# Patient Record
Sex: Female | Born: 1970 | Race: Black or African American | Hispanic: No | Marital: Married | Smoking: Never smoker
Health system: Southern US, Community
[De-identification: ages and names within clinical notes are randomized; demographics above are authoritative.]

## PROBLEM LIST (undated history)

## (undated) DIAGNOSIS — N289 Disorder of kidney and ureter, unspecified: Secondary | ICD-10-CM

---

## 2018-04-05 ENCOUNTER — Other Ambulatory Visit: Payer: Self-pay

## 2018-04-05 ENCOUNTER — Emergency Department (HOSPITAL_COMMUNITY)
Admission: EM | Admit: 2018-04-05 | Discharge: 2018-04-05 | Disposition: A | Discharge status: Home / Self Care | Attending: Emergency Medicine | Admitting: Emergency Medicine

## 2018-04-05 ENCOUNTER — Encounter (HOSPITAL_COMMUNITY): Payer: Self-pay

## 2018-04-05 ENCOUNTER — Emergency Department (HOSPITAL_COMMUNITY)

## 2018-04-05 DIAGNOSIS — R1031 Right lower quadrant pain: Secondary | ICD-10-CM | POA: Insufficient documentation

## 2018-04-05 DIAGNOSIS — R109 Unspecified abdominal pain: Secondary | ICD-10-CM

## 2018-04-05 HISTORY — DX: Disorder of kidney and ureter, unspecified: N28.9

## 2018-04-05 LAB — URINALYSIS, ROUTINE W REFLEX MICROSCOPIC
Bilirubin Urine: NEGATIVE
Glucose, UA: NEGATIVE mg/dL
Hgb urine dipstick: NEGATIVE
Ketones, ur: NEGATIVE mg/dL
Leukocytes,Ua: NEGATIVE
Nitrite: NEGATIVE
Protein, ur: NEGATIVE mg/dL
SPECIFIC GRAVITY, URINE: 1.015 (ref 1.005–1.030)
pH: 5 (ref 5.0–8.0)

## 2018-04-05 LAB — HEPATIC FUNCTION PANEL
ALT: 15 U/L (ref 0–44)
AST: 19 U/L (ref 15–41)
Albumin: 3.7 g/dL (ref 3.5–5.0)
Alkaline Phosphatase: 88 U/L (ref 38–126)
Bilirubin, Direct: <0.1 mg/dL (ref 0.0–0.2)
Total Bilirubin: 0.8 mg/dL (ref 0.3–1.2)
Total Protein: 6.4 g/dL — ABNORMAL LOW (ref 6.5–8.1)

## 2018-04-05 LAB — I-STAT BETA HCG BLOOD, ED (MC, WL, AP ONLY): I-stat hCG, quantitative: <5 m[IU]/mL (ref <5)

## 2018-04-05 LAB — CBC
HCT: 38.4 % (ref 36.0–46.0)
Hemoglobin: 12.6 g/dL (ref 12.0–15.0)
MCH: 28.3 pg (ref 26.0–34.0)
MCHC: 32.8 g/dL (ref 30.0–36.0)
MCV: 86.1 fL (ref 80.0–100.0)
NRBC: 0 % (ref 0.0–0.2)
Platelets: 195 10*3/uL (ref 150–400)
RBC: 4.46 MIL/uL (ref 3.87–5.11)
RDW: 12 % (ref 11.5–15.5)
WBC: 7.8 10*3/uL (ref 4.0–10.5)

## 2018-04-05 LAB — LIPASE, BLOOD: Lipase: 34 U/L (ref 11–51)

## 2018-04-05 LAB — BASIC METABOLIC PANEL
Anion gap: 7 (ref 5–15)
BUN: 9 mg/dL (ref 6–20)
CO2: 24 mmol/L (ref 22–32)
Calcium: 9 mg/dL (ref 8.9–10.3)
Chloride: 106 mmol/L (ref 98–111)
Creatinine, Ser: 0.77 mg/dL (ref 0.44–1.00)
GFR calc non Af Amer: >60 mL/min (ref >60)
Glucose, Bld: 87 mg/dL (ref 70–99)
Potassium: 4.5 mmol/L (ref 3.5–5.1)
Sodium: 137 mmol/L (ref 135–145)

## 2018-04-05 MED ORDER — KETOROLAC TROMETHAMINE 30 MG/ML IJ SOLN
30.0000 mg | Freq: Once | INTRAMUSCULAR | Status: AC
  Administered 2018-04-05: 30 mg via INTRAMUSCULAR
  Filled 2018-04-05: qty 1

## 2018-04-05 MED ORDER — DICLOFENAC SODIUM 1 % TD GEL: 2.0000 g | Freq: Four times a day (QID) | TRANSDERMAL | 0 refills | Status: AC | PRN

## 2018-04-05 NOTE — ED Triage Notes (Signed)
Pt reports right flank pain. States hx of kidney infections. No hx of kidney stones. Denies n/v. Pt reports she has been taking OTC ibuprofen.

## 2018-04-05 NOTE — ED Provider Notes (Signed)
Emergency Department Provider Note   I have reviewed the triage vital signs and the nursing notes.   HISTORY  Chief Complaint Flank Pain   HPI Sherry Lee is a 48 y.o. female with prior history of kidney infection presents to the emergency department with right flank pain.  Pain began yesterday.  She describes it as her right flank without significant radiation to the anterior abdomen.  She denies any dysuria, hesitancy, urgency.  No vaginal bleeding or discharge.  No concern for sexually transmitted infections.  Patient denies any known injury.  No history of kidney stones.  No nausea, vomiting, diarrhea.  She took ibuprofen which helped somewhat but pain lingers.   Past Medical History:  Diagnosis Date  . Renal disorder    Kidney Infection    There are no active problems to display for this patient.   History reviewed. No pertinent surgical history.    Allergies Bactrim [sulfamethoxazole-trimethoprim]  History reviewed. No pertinent family history.  Social History Social History   Tobacco Use  . Smoking status: Never Smoker  . Smokeless tobacco: Never Used  Substance Use Topics  . Alcohol use: Yes    Comment: social   . Drug use: Never    Review of Systems  Constitutional: No fever/chills Eyes: No visual changes. ENT: No sore throat. Cardiovascular: Denies chest pain. Respiratory: Denies shortness of breath. Gastrointestinal: No abdominal pain.  No nausea, no vomiting.  No diarrhea.  No constipation. Right flank pain.  Genitourinary: Negative for dysuria. Musculoskeletal: Negative for back pain. Skin: Negative for rash. Neurological: Negative for headaches, focal weakness or numbness.  10-point ROS otherwise negative.  ____________________________________________   PHYSICAL EXAM:  VITAL SIGNS: ED Triage Vitals  Enc Vitals Group     BP 04/05/18 1132 (!) 145/103     Pulse Rate 04/05/18 1132 65     Resp 04/05/18 1132 18     Temp  04/05/18 1132 98.1 F (36.7 C)     Temp Source 04/05/18 1132 Oral     SpO2 04/05/18 1132 100 %     Pain Score 04/05/18 1134 5   Constitutional: Alert and oriented. Well appearing and in no acute distress. Eyes: Conjunctivae are normal.  Head: Atraumatic. Nose: No congestion/rhinnorhea. Mouth/Throat: Mucous membranes are moist.  Oropharynx non-erythematous. Neck: No stridor.   Cardiovascular: Normal rate, regular rhythm. Good peripheral circulation. Grossly normal heart sounds.   Respiratory: Normal respiratory effort.  No retractions. Lungs CTAB. Gastrointestinal: Soft and nontender. No distention. Mild/modertae right tenderness over the flank and musculature. Mild CVA tenderness.  Musculoskeletal: No lower extremity tenderness nor edema. No gross deformities of extremities. Neurologic:  Normal speech and language. No gross focal neurologic deficits are appreciated.  Skin:  Skin is warm, dry and intact. No rash noted.  ____________________________________________   LABS (all labs ordered are listed, but only abnormal results are displayed)  Labs Reviewed  HEPATIC FUNCTION PANEL - Abnormal; Notable for the following components:      Result Value   Total Protein 6.4 (*)    All other components within normal limits  URINALYSIS, ROUTINE W REFLEX MICROSCOPIC  BASIC METABOLIC PANEL  CBC  LIPASE, BLOOD  I-STAT BETA HCG BLOOD, ED ([facility], [facility], [facility] ONLY)   ____________________________________________  RADIOLOGY  Ct Renal Stone Study  Result Date: 04/05/2018 CLINICAL DATA:  Right flank pain for 1 day. EXAM: CT ABDOMEN AND PELVIS WITHOUT CONTRAST TECHNIQUE: Multidetector CT imaging of the abdomen and pelvis was performed following the standard protocol without IV  contrast. COMPARISON:  None. FINDINGS: Lower chest: Clear lung bases.  Heart normal in size. Hepatobiliary: 13 mm low-density lesion, right liver lobe, consistent with a cyst. No other liver masses or lesions. Normal gallbladder. No  bile duct dilation. Pancreas: Unremarkable. No pancreatic ductal dilatation or surrounding inflammatory changes. Spleen: Normal in size without focal abnormality. Adrenals/Urinary Tract: Adrenal glands are unremarkable. Kidneys are normal, without renal calculi, focal lesion, or hydronephrosis. Bladder is unremarkable. Stomach/Bowel: Normal stomach. Small bowel is normal caliber with no wall thickening or inflammatory changes. Colon is normal in caliber. No wall thickening or inflammation. Mild generalized increased colonic stool burden. Normal appendix visualized. Vascular/Lymphatic: No significant vascular findings are present. No enlarged abdominal or pelvic lymph nodes. Reproductive: Retroverted uterus. Posterior partly calcified subserosal fibroid. No adnexal masses. Other: No abdominal wall hernia or abnormality. No abdominopelvic ascites. Musculoskeletal: No acute or significant osseous findings. IMPRESSION: 1. No acute findings. No renal or ureteral stones or obstructive uropathy. No findings to account for right flank pain. 2. Mild diffuse increased colonic stool burden. 3. Small liver cyst.  No other abnormalities. Electronically Signed   By: Amie Portland M.D.   On: 04/05/2018 13:10    ____________________________________________   PROCEDURES  Procedure(s) performed:   Procedures  None ____________________________________________   INITIAL IMPRESSION / ASSESSMENT AND PLAN / ED COURSE  Pertinent labs & imaging results that were available during my care of the patient were reviewed by me and considered in my medical decision making (see chart for details).  Patient presents to the emergency department for evaluation of right flank pain.  She has tenderness along the right flank to palpation over the posterior/lateral musculature.  No bruising or crepitus.  Urinalysis shows no evidence of infection or blood.  Plan for CT renal and reassess.  CT renal reviewed with no acute findings. UA  negative for infection. Plan for MSK treatment and reassess.  ____________________________________________  FINAL CLINICAL IMPRESSION(S) / ED DIAGNOSES  Final diagnoses:  Right flank pain     MEDICATIONS GIVEN DURING THIS VISIT:  Medications  ketorolac (TORADOL) 30 MG/ML injection 30 mg (30 mg Intramuscular Given 04/05/18 1318)     NEW OUTPATIENT MEDICATIONS STARTED DURING THIS VISIT:  Discharge Medication List as of 04/05/2018  2:09 PM    START taking these medications   Details  diclofenac sodium (VOLTAREN) 1 % GEL Apply 2 g topically 4 (four) times daily as needed., Starting Tue 04/05/2018, Print        Note:  This document was prepared using Dragon voice recognition software and may include unintentional dictation errors.  Alona Bene, MD Emergency Medicine    Tashia Leiterman, Arlyss Repress, MD 04/06/18 [phone]

## 2018-04-05 NOTE — Discharge Instructions (Signed)

## 2018-04-05 NOTE — ED Notes (Signed)
Declined W/C at D/C and was escorted to lobby by RN. 

## 2020-03-27 IMAGING — CT CT RENAL STONE PROTOCOL
2 of 4 series · 17 of 46 positions shown, 19 images · non-contrast
Comparison: None.

CLINICAL DATA: Right flank pain for 1 day.

EXAM:
CT ABDOMEN AND PELVIS WITHOUT CONTRAST
TECHNIQUE: Multidetector CT imaging of the abdomen and pelvis was performed
following the standard protocol without IV contrast.

[Series 3: ap without · axial · non-contrast · 0.76mm/px · z∈[+1113,+1493]mm · 14 of 86 slices shown, 16 images]
[im 5/86  soft-tissue]
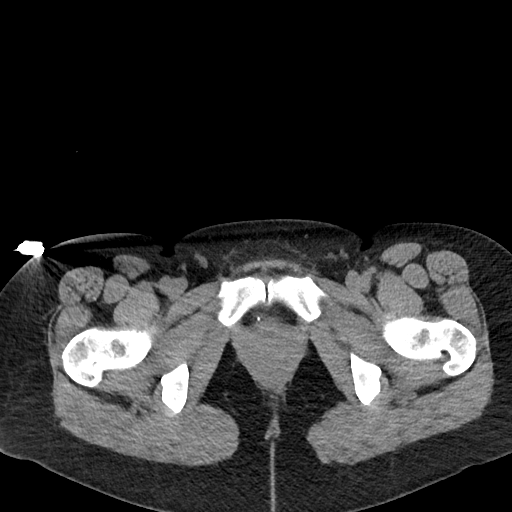
[im 5/86  bone]
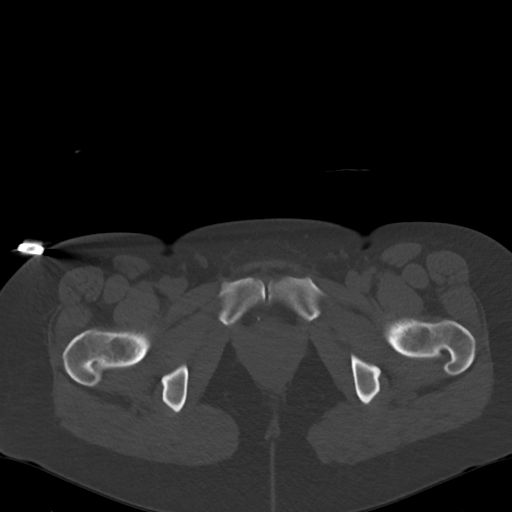
[im 13/86  soft-tissue]
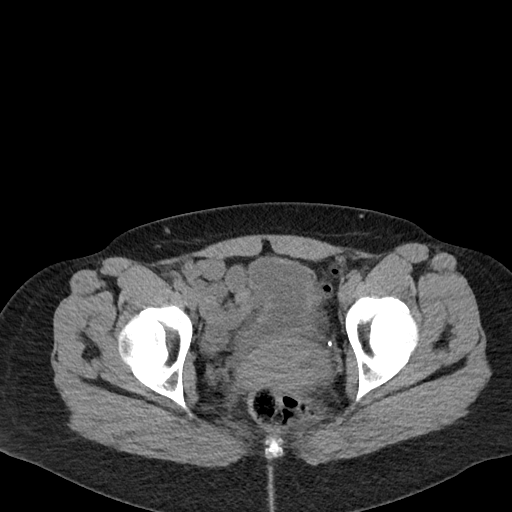
[im 18/86  soft-tissue]
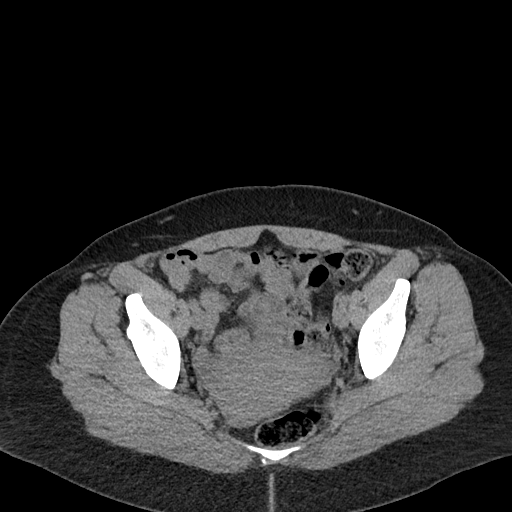
[im 22/86  soft-tissue]
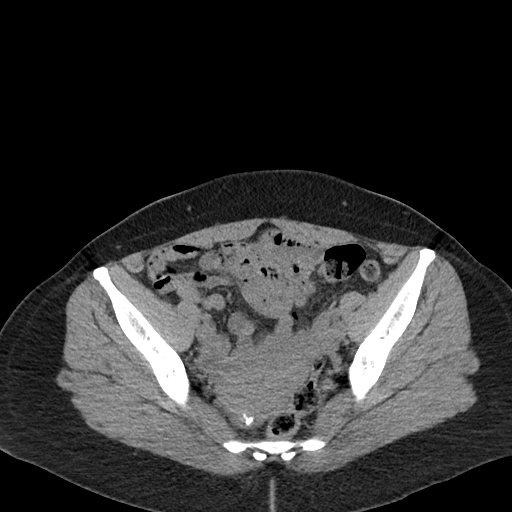
[im 30/86  soft-tissue]
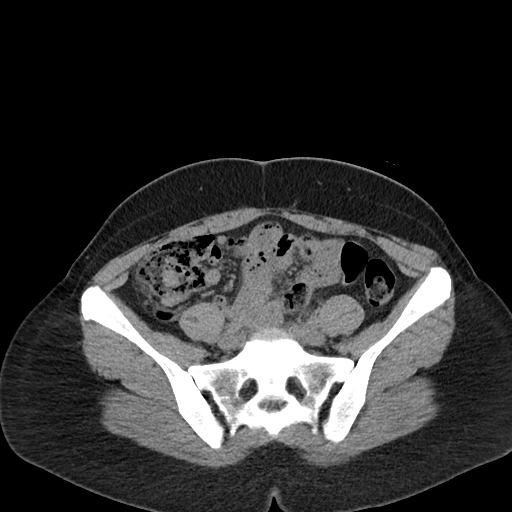
[im 35/86  soft-tissue]
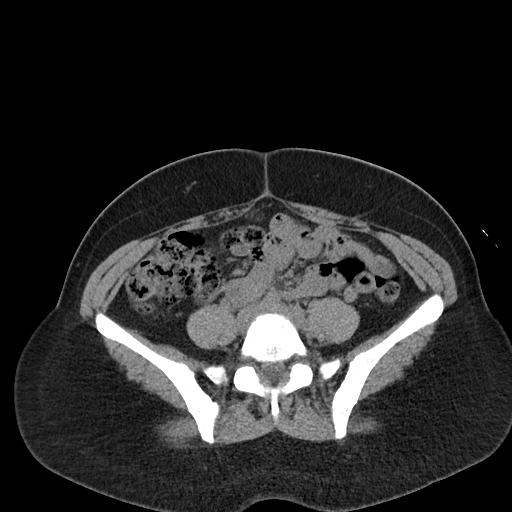
[im 39/86  soft-tissue]
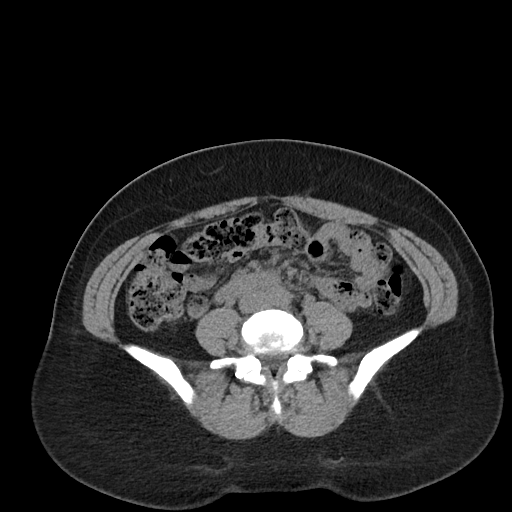
[im 47/86  soft-tissue]
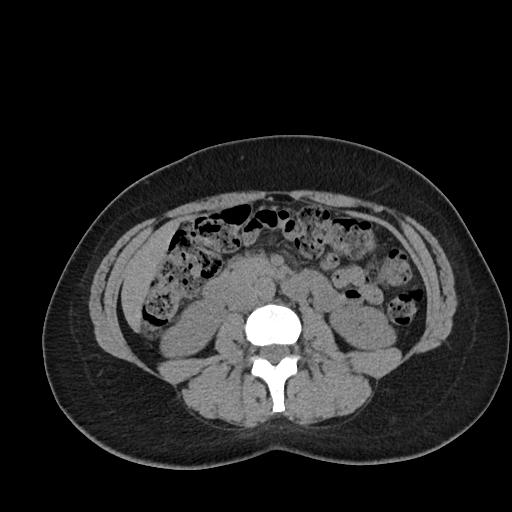
[im 52/86  soft-tissue]
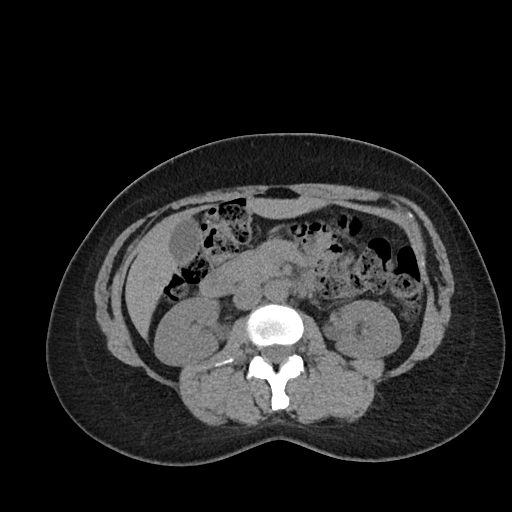
[im 52/86  bone]
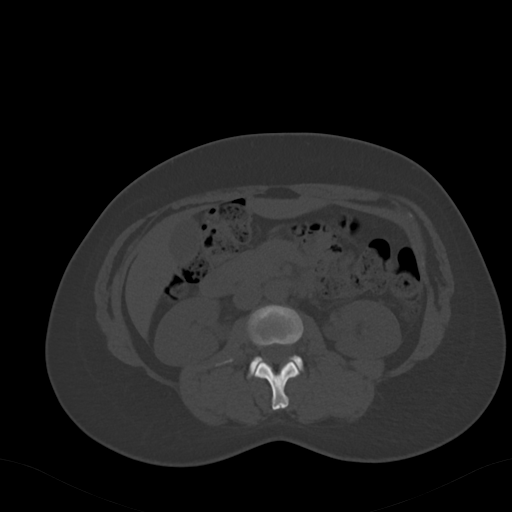
[im 56/86  soft-tissue]
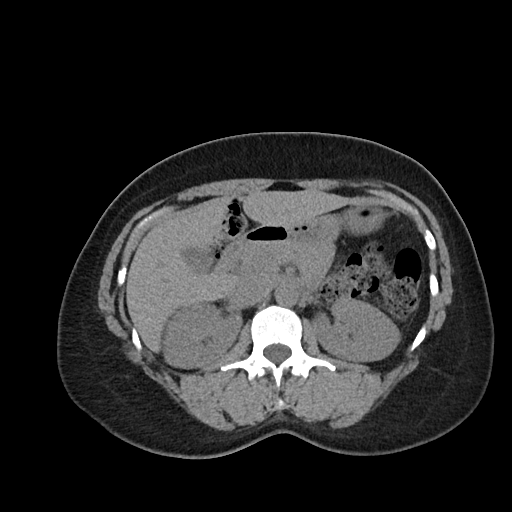
[im 64/86  soft-tissue]
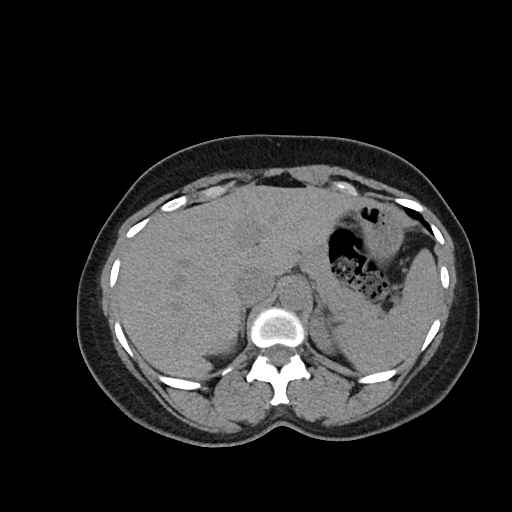
[im 69/86  soft-tissue]
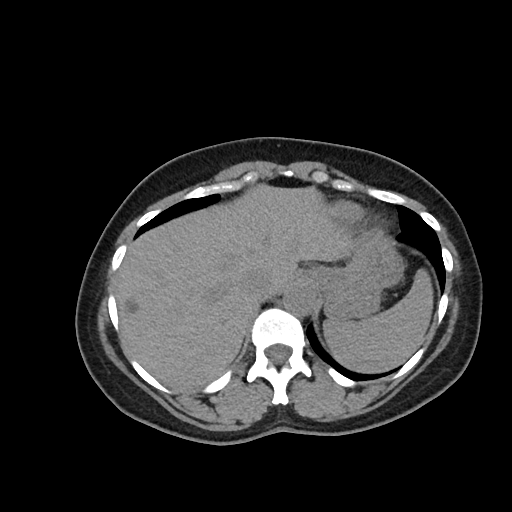
[im 73/86  soft-tissue]
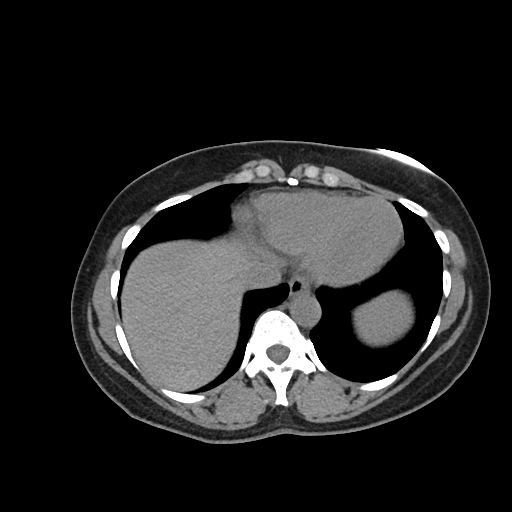
[im 81/86  soft-tissue]
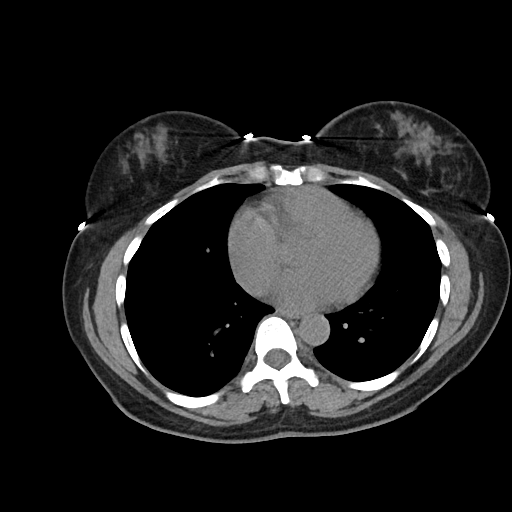

[Series 6: cor · coronal · 0.87mm/px · 3 of 124 slices shown]
[im 42/124  soft-tissue]
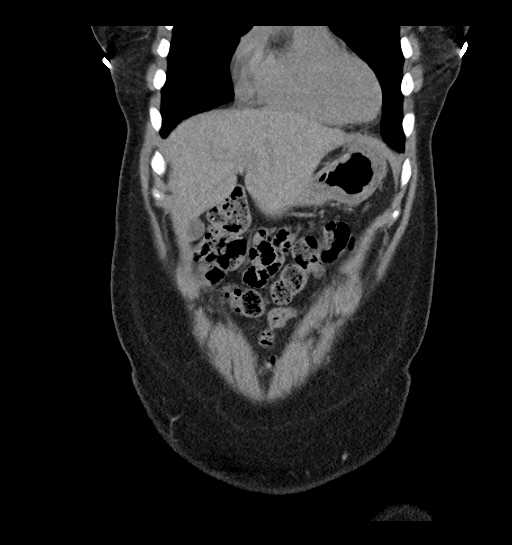
[im 55/124  soft-tissue]
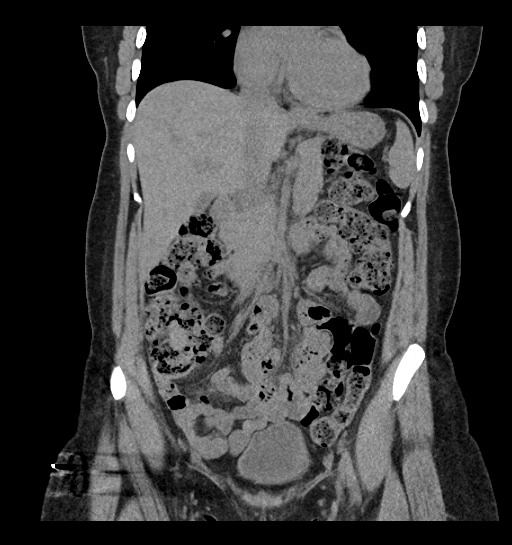
[im 69/124  soft-tissue]
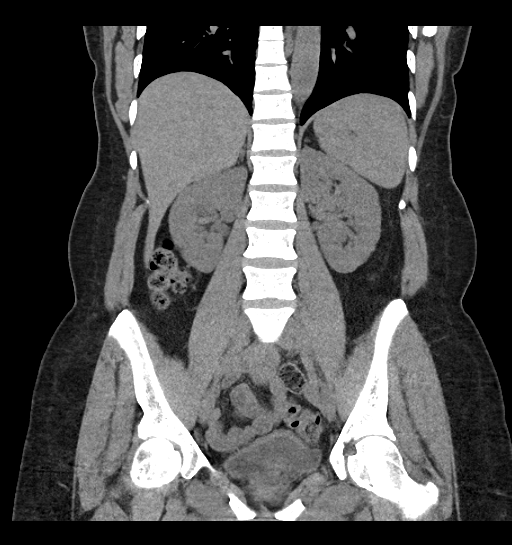

[17 of 46 positions shown; findings below may reference images not displayed]

FINDINGS: Lower chest: Clear lung bases.  Heart normal in size.

Hepatobiliary: 13 mm low-density lesion, right liver lobe,
consistent with a cyst. No other liver masses or lesions. Normal
gallbladder. No bile duct dilation.

Pancreas: Unremarkable. No pancreatic ductal dilatation or
surrounding inflammatory changes.

Spleen: Normal in size without focal abnormality.

Adrenals/Urinary Tract: Adrenal glands are unremarkable. Kidneys are
normal, without renal calculi, focal lesion, or hydronephrosis.
Bladder is unremarkable.

Stomach/Bowel: Normal stomach. Small bowel is normal caliber with no
wall thickening or inflammatory changes. Colon is normal in caliber.
No wall thickening or inflammation. Mild generalized increased
colonic stool burden.

Normal appendix visualized.

Vascular/Lymphatic: No significant vascular findings are present. No
enlarged abdominal or pelvic lymph nodes.

Reproductive: Retroverted uterus. Posterior partly calcified
subserosal fibroid. No adnexal masses.

Other: No abdominal wall hernia or abnormality. No abdominopelvic
ascites.

Musculoskeletal: No acute or significant osseous findings.
IMPRESSION: 1. No acute findings. No renal or ureteral stones or obstructive
uropathy. No findings to account for right flank pain.
2. Mild diffuse increased colonic stool burden.
3. Small liver cyst.  No other abnormalities.

## 2022-06-25 ENCOUNTER — Encounter: Payer: No Typology Code available for payment source | Attending: Neurology | Admitting: Dietician

## 2022-06-25 ENCOUNTER — Encounter: Payer: Self-pay | Admitting: Dietician

## 2022-06-25 VITALS — Ht 68.75 in | Wt 195.3 lb

## 2022-06-25 DIAGNOSIS — E663 Overweight: Secondary | ICD-10-CM | POA: Insufficient documentation

## 2022-06-25 DIAGNOSIS — Z713 Dietary counseling and surveillance: Secondary | ICD-10-CM | POA: Insufficient documentation

## 2022-06-25 DIAGNOSIS — E119 Type 2 diabetes mellitus without complications: Secondary | ICD-10-CM | POA: Insufficient documentation

## 2022-06-25 DIAGNOSIS — E669 Obesity, unspecified: Secondary | ICD-10-CM | POA: Insufficient documentation

## 2022-06-25 NOTE — Progress Notes (Signed)
Medical Nutrition Therapy  Appointment Start time:  10:17  Appointment End time:  11:25  Primary concerns today: help getting off metformin with a little weight loss Referral diagnosis: Overweight Preferred learning style: no preference indicated (auditory, visual, hands on, no preference indicated) Learning readiness: preparation (not ready, contemplating, ready, change in progress)  NUTRITION ASSESSMENT   Anthropometrics  Weight: 195.3 lb Height: 68.75 in  Clinical Medical Hx: type 2 diabetes Medications: metformin Labs: A1c 6.5, Cholesterol 231, HDL 75.1, LDL 134.0 Notable Signs/Symptoms: none noted  Lifestyle & Dietary Hx  Pt states she wants to get off of Metformin. Pt states she doesn't do a lot of frying. Pt states she has not bought a loaf of bread in a while. Pt states she wants to lose 10 lbs by October.  Estimated daily fluid intake: 64 oz Supplements: no Sleep: good, 6 hours a night Stress / self-care: listen to music, Zumba Current average weekly physical activity: Zumba 2 days a week for 50 minutes; yard work  24-Hr Dietary Recall First Meal: 2 boiled eggs or half a bagel, sausage or bacon, banana or cereal or apple or grapes green smoothie (spring mix, greek yogurt, flax seed, fruit) or hash browns Snack: cashews or almonds or pastries or popcorn (w/ butter) or fruit Second Meal: skip since maybe having snacks or left overs or hot dog Snack: cashews or almonds or pastries or popcorn (w/ butter) or fruit Third Meal: ground [place] (taco salad), rice or pasta with shrimp, bell peppers, sausage or spaghetti with ground [place] or soup with grilled cheese or [place] burger or protein salad (Tyson chicken strips, lettuce, and other vegetables) or baked potato Snack: fruit or graham crackers or vanilla wafers Beverages: water, Gatorade (1 after yard work weekly), 1 alcoholic drink on the weekend  Estimated Energy Needs Calories: 1600  NUTRITION DIAGNOSIS  [place]-3.3  Overweight/obesity As related to reduced physical activity and excessive nutrient intake.  As evidenced by patient physical activity and dietary recall.   NUTRITION INTERVENTION  Nutrition education (E-1) on the following topics:  Fruits & Vegetables: Aim to fill half your plate with a variety of fruits and vegetables. They are rich in vitamins, minerals, and fiber, and can help reduce the risk of chronic diseases. Choose a colorful assortment of fruits and vegetables to ensure you get a wide range of nutrients. Grains and Starches: Make at least half of your grain choices whole grains, such as Alleen Kehm rice, whole wheat bread, and oats. Whole grains provide fiber, which aids in digestion and healthy cholesterol levels. Aim for whole forms of starchy vegetables such as potatoes, sweet potatoes, beans, peas, and corn, which are fiber rich and provide many vitamins and minerals.  Protein: Incorporate lean sources of protein, such as poultry, fish, beans, nuts, and seeds, into your meals. Protein is essential for building and repairing tissues, staying full, balancing blood sugar, as well as supporting immune function. Dairy: Include low-fat or fat-free dairy products like milk, yogurt, and cheese in your diet. Dairy foods are excellent sources of calcium and vitamin D, which are crucial for bone health.  Physical Activity: Regular physical activity promotes overall health-including helping to reduce risk for heart disease and diabetes, promoting mental health, and helping [place] sleep better.  Why you need complex carbohydrates: Whole grains and other complex carbohydrates are required to have a healthy diet. Whole grains provide fiber which can help with blood glucose levels and help keep you satiated. Fruits and starchy vegetables provide essential vitamins and  minerals required for immune function, eyesight support, brain support, bone density, wound healing and many other functions within the body. According to  the current evidenced based 2020-2025 Dietary Guidelines for Americans, complex carbohydrates are part of a healthy eating pattern which is associated with a decreased risk for type 2 diabetes, cancers, and cardiovascular disease.  Health benefits of physical activity.  Handouts Provided Include  Types of Fat (saturated vs unsaturated) Health Benefits of Physical Activity Meal Ideas handout  Learning Style & Readiness for Change Teaching method utilized: Visual & Auditory  Demonstrated degree of understanding via: Teach Back  Barriers to learning/adherence to lifestyle change: nothing identified  Goals Established by Pt Increase intentional moderate physical activity; aim for 30 minutes per day on days you do not have Zumba. Decrease simple carbohydrates like cookies (nilla wafers), increase complex carbohydrates; read labels for added sugar. Decrease fat intake by decreasing foods high in saturated fat .  MONITORING & EVALUATION Dietary intake, weekly physical activity.  Next Steps  Patient is to follow-up in two months.

## 2022-08-27 ENCOUNTER — Ambulatory Visit: Admitting: Dietician

## 2023-12-10 ENCOUNTER — Other Ambulatory Visit: Payer: Self-pay

## 2023-12-10 DIAGNOSIS — R928 Other abnormal and inconclusive findings on diagnostic imaging of breast: Secondary | ICD-10-CM

## 2023-12-24 ENCOUNTER — Other Ambulatory Visit

## 2023-12-24 ENCOUNTER — Ambulatory Visit
Admission: RE | Admit: 2023-12-24 | Discharge: 2023-12-24 | Disposition: A | Discharge status: Home / Self Care | Source: Ambulatory Visit

## 2023-12-24 DIAGNOSIS — R928 Other abnormal and inconclusive findings on diagnostic imaging of breast: Secondary | ICD-10-CM
